# Patient Record
Sex: Male | Born: 2000 | Race: White | Hispanic: No | Marital: Single | Smoking: Never smoker
Health system: Southern US, Community
[De-identification: ages and names within clinical notes are randomized; demographics above are authoritative.]

## PROBLEM LIST (undated history)

## (undated) DIAGNOSIS — F988 Other specified behavioral and emotional disorders with onset usually occurring in childhood and adolescence: Secondary | ICD-10-CM

## (undated) DIAGNOSIS — J302 Other seasonal allergic rhinitis: Secondary | ICD-10-CM

---
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
## 2020-02-18 NOTE — Telephone Encounter (Signed)
 Yes, will send a letter out.   Thanks, Cherrie NOVAK    Electronically signed by: Cherrie Nat Schmitz, MSW 02/18/20 [phone]

## 2020-03-31 NOTE — Telephone Encounter (Signed)
 Yes, I will order [name] a new nebulizer. Were you able to confirm the address on file with family? Will need to this to be accurate for DME company    Electronically signed by: Cherrie Nat Schmitz, MSW 03/31/20 [phone]

## 2020-04-10 NOTE — Progress Notes (Signed)
 Cystic Fibrosis Psychology Pediatric CF Clinic Outpatient Progress Note   Name: Frederick Curry Date of birth (age): 2000/09/18 (19 y.o.) Date of contact: 04/10/20 Contact number: 3 Length of contact: 15 minutes   Nature of contact: This service meets with patients with cystic fibrosis to assess psychological functioning and address related emotional and/or behavioral concerns. This provider met with Frederick Curry for 15 minutes during his CF clinic visit.   Behavioral observations: Frederick Curry presented as a well-groomed 19 y.o. male. He presented with appropriate affect and was open, honest and engaged with this provider. Insight and judgement were fair. He was alert and oriented x3.   Annual CF Mental Health Screening: Frederick Curry completed the Patient Health Questionnaire-9 (PHQ-9) and the Generalized Anxiety Disorder-7 (GAD-7) as part of the annual CFF Mental Health Screening, and he was re-screened due to previously elevated scores. his scores today were as follows:  PHQ-9: 6 (Mild, improved from 10 at his previous visit) GAD-7:  9 (Mild)  Frederick Curry reported experiencing the following symptoms more than half the days of the past two weeks: feeling down, depressed or hopeless; feeling tired or having little energy; and feeling nervous, anxious, or on edge.SABRA He also reported experiencing the following symptoms nearly every day during the past two weeks: worrying too much about different things and becoming easily annoyed or irritable. Frederick Curry denied suicidal ideation on question 9 of the PHQ-9.  Frederick Curry reported that overall, things have been going ok for him. He is working in his [employer]. He feels that his mood has been pretty good and denied significant concerns. He denied any SI/HI/AVH and noted that his feelings towards his girlfriend have improved.   Intervention/Plan: Frederick Curry will be re-screened at his next clinic  visit. He was encouraged to reach out should he need anything in the future. We will continue to follow.   Impressions: Cystic fibrosis E84.9  Frederick Shuck, PhD Licensed Psychologist, Health Services Provider Phone: [phone]    Electronically signed by: Chad Ryan Marion, DO 04/12/20 1415

## 2020-11-26 NOTE — ED Provider Notes (Signed)
 ------------------------------------------------------------------------------- Attestation signed by Orlie Elsie Ash, MD at 11/27/2020  6:56 PM  ED Attending Physician:  Frederick Curry is a 20 y.o. male who presents with  Chief Complaint  Patient presents with  . Chest Pain    Pt reports left sided CP/lung pain x approx 2 hours; denies N/V/D, endorses headache   I supervised care provided by the resident and we have discussed the case. I have seen and examined the patient. I have reviewed the resident note and agree with the plan of treatment. ED data including laboratory and imaging studies reviewed by me and considered in medical decision making, and I agree with interpretations as documented.   -------------------------------------------------------------------------------   History   Chief Complaint  Patient presents with  . Chest Pain    Pt reports left sided CP/lung pain x approx 2 hours; denies N/V/D, endorses headache   HPI Frederick Curry is a 20 year old male with past medical history of cystic fibrosis who presents the emergency department for 2 hours of nonprogressive left-sided chest pain.  The patient states that he has been noncompliant with his cystic fibrosis daily therapeutic regimen including physical therapy and inhalers over the past 2 days.  This afternoon he states that he began feeling diffuse chest pain that is since localized to his left chest.  He states that this is the exact progression of symptoms and localization of pain that he typically experiences with pneumonia.  He does have associated shortness of breath that is mildly worse than baseline.  He states that his cough and sputum production have been baseline for him.  He has been afebrile at home with multiple temperature recordings at 98 to 99 F.  He has had no sick contacts lately.  He is otherwise feeling well  Past Medical History:  Diagnosis Date  . ADHD (attention deficit hyperactivity disorder)    . Asthma   . Cystic fibrosis (HCC)   . Staph infection    recent hospitilization for staph infectionin lungs per dad     Past Surgical History:  Procedure Laterality Date  . BOTOX INJECTION N/A 07/12/2016   Procedure: BOTOX INJECTION;  Surgeon: Marcey Lynwood Georgi, MD;  Location: [facility] PEDS OR;  Service: Urology;  Laterality: N/A;  . BOTOX INJECTION N/A 10/18/2016   Procedure: BOTOX INJECTION;  Surgeon: Marcey Lynwood Georgi, MD;  Location: [facility] PEDS OR;  Service: Urology;  Laterality: N/A;  . COSMETIC SURGERY  dog bite to face  . CYSTOSCOPY N/A 10/18/2016   Procedure: CYSTOSCOPY;  Surgeon: Marcey Lynwood Georgi, MD;  Location: [facility] PEDS OR;  Service: Urology;  Laterality: N/A;  . PORTACATH PLACEMENT  05/22/2012   Procedure: PORT-A-CATH INSERTION;  Surgeon: Norleen Vinie Jerry, MD;  Location: [facility] PEDS OR;  Service: Pediatric General;  Laterality: N/A;  Req 35  -  CONTACT PRECAUTIONS  Please place to follow first case of the day  . PORTACATH PLACEMENT  06/26/2012   Procedure: PORT-A-CATH INSERTION;  Surgeon: Norleen Vinie Jerry, MD;  Location: [facility] PEDS OR;  Service: Pediatric General;  Laterality: N/A;  . PORTACATH PLACEMENT N/A 07/15/2013   Procedure: PORT-A-CATH INSERTION;  Surgeon: Norleen Vinie Jerry, MD;  Location: [facility] PEDS OR;  Service: Pediatric General;  Laterality: N/A;  . PORTACATH REMOVAL  06/26/2012   Procedure: PORT-A-CATH REMOVAL;  Surgeon: Norleen Vinie Jerry, MD;  Location: [facility] PEDS OR;  Service: Pediatric General;  Laterality: N/A;  Remove and replace portacath  . PORTACATH REMOVAL N/A 07/15/2013   Procedure: PORT-A-CATH REMOVAL;  Surgeon: Norleen  Vinie Jerry, MD;  Location: [facility] PEDS OR;  Service: Pediatric General;  Laterality: N/A;  . PORTACATH REMOVAL Left 06/27/2017   Procedure: PORT-A-CATH REMOVAL;  Surgeon: Rea Earnie Situ, MD;  Location: [facility] PEDS OR;  Service: Pediatric General;  Laterality: Left;  . SKIN GRAFT      Family History  Problem Relation Age of Onset  . Heart disease Neg Hx      Social History   Tobacco Use  . Smoking status: Passive Smoke Exposure - Never Smoker  . Smokeless tobacco: Never Used  Vaping Use  . Vaping Use: Never used  Substance Use Topics  . Alcohol use: No     Review of Systems  Constitutional: Negative for chills and fever.  HENT: Negative for congestion.   Eyes: Negative for visual disturbance.  Respiratory: Positive for shortness of breath.   Cardiovascular: Positive for chest pain. Negative for leg swelling.  Gastrointestinal: Negative for abdominal pain and blood in stool.  Genitourinary: Negative for dysuria and hematuria.  Musculoskeletal: Negative for back pain.  Skin: Negative for rash.  Neurological: Negative for headaches.    Physical Exam    ED Triage Vitals [11/26/20 1749]  BP 129/96  MAP (mmHg) 103  Pulse 65  Resp 14  Temp 97.8 F (36.6 C)  SpO2 98 %  Weight     Physical Exam Constitutional:      General: He is not in acute distress.    Appearance: He is normal weight. He is not ill-appearing, toxic-appearing or diaphoretic.     Comments: Young thin appearing male in no acute distress.  HENT:     Head: Normocephalic and atraumatic.     Nose: No rhinorrhea.     Mouth/Throat:     Mouth: Mucous membranes are moist.  Eyes:     General: No scleral icterus.       Right eye: No discharge.        Left eye: No discharge.     Extraocular Movements: Extraocular movements intact.     Conjunctiva/sclera: Conjunctivae normal.  Cardiovascular:     Rate and Rhythm: Normal rate and regular rhythm.     Heart sounds: Normal heart sounds. No murmur heard. Pulmonary:     Effort: Pulmonary effort is normal. No respiratory distress.     Breath sounds: Normal breath sounds. No wheezing, rhonchi or rales.     Comments: Lungs are generally clear to auscultation bilaterally with potentially minimally reduced breath sounds at the left lung base.  Patient is speaking in full sentences without difficulty and is  nontachypneic.  There are no signs of respiratory distress. Chest:     Chest wall: No tenderness.  Abdominal:     General: Abdomen is flat. Bowel sounds are normal. There is no distension.     Palpations: Abdomen is soft. There is no mass.     Tenderness: There is no abdominal tenderness. There is no guarding or rebound.  Musculoskeletal:     Cervical back: Neck supple.     Right lower leg: No edema.     Left lower leg: No edema.  Skin:    General: Skin is warm and dry.     Findings: No rash.  Neurological:     General: No focal deficit present.     Mental Status: He is alert and oriented to person, place, and time. Mental status is at baseline.  Psychiatric:        Mood and Affect: Mood normal.  Behavior: Behavior normal.       ED Course  Procedures       MDM MDM    ER provider interpretation of imaging: I have personally reviewed patient's chest x-ray which was unremarkable for acute intrathoracic pathology. ER provider interpretation of EKG: Patient's EKG was remarkable for 1 mm of J-point elevation in leads V2 AVF, V5 and V6 with no prior EKG for comparison.  Patient was also noted to have hyperacute T waves.  ER provider interpretation of labs: Initial troponin of 4 is nonconcerning for ACS especially given his age and lack of risk factors. CBC and CMP unremarkable Medical decision making: Upon presentation, the patient is hemodynamically stable, afebrile and has been saturating at 98% on room air without any signs of respiratory distress or any focal findings on exam whatsoever.  His exam is generally reassuring.  As above, the patient's chest x-ray did not demonstrate any signs of pneumonia.  Patient's EKG demonstrated J-point elevation in forementioned leads with no prior for comparison.  I have briefly discussed the patient's EKG with the on-call cardiologist who agrees that this is J-point elevation and is normal for age.   Serial reassessment demonstrates that  the patient remains hemodynamically stable and is not demonstrating any signs of respiratory distress.  He continues to saturate at 98% on room air.  He remains afebrile.   Courtesy call was made to pulmonology informing that the patient had presented to the emergency department.  Per discussion option of the patient's presentation, pulmonology agrees that the patient is safe for discharge home.  Pulmonology that the patient is to will arrange arrange outpatient follow-up the following day or as soon as possible.  Plan plan to discharge patient home with strict return precautions for fever or increasing cough or shortness of breath.  The patient was instructed to resume his daily CF physiotherapy and medication regimen.  He was further told that the pulmonology clinic will be reaching out to him and that he should not schedule the next available appointment.  The patient was discharged home in stable condition    Clinical Impression:  1. Dyspnea on exertion   2. Left-sided chest pain     ED Disposition    ED Disposition Condition Comment   Discharge Stable [name] Norman Greet discharge to home/self care.                   Electronically signed by: Lynwood Fleming, MD Resident 11/27/20 1241    Electronically signed by: Elsie Merton Dollar, MD 11/27/20 [phone]

## 2020-11-26 NOTE — ED Triage Notes (Signed)
 Pt reports left sided CP/lung pain x approx 2 hours; denies N/V/D, endorses headache

## 2020-12-05 NOTE — Telephone Encounter (Signed)
 Left a generic message on Lennin's cell phone encouraging him to call back as needed.   Electronically signed by: Comer Jenkins Shuck, PhD 12/05/20 1215

## 2020-12-20 NOTE — Telephone Encounter (Signed)
 Returned call and spoke with Alysa. I let her know that I was calling as requested by [name] and that he gave me permission to speak with her. I asked her what questions she had that I could help answer.   Stewart shares that Maveryk is living with them and our big thing right now is that he has to work. I know that he has this illness and might not feel good, but we all have those moments.   I shared that we courage our patients with CF to work if they would like and our clinic role in that is to help support them as necessary. I explained that patients with CF might need to stay home from work if they are sick. I briefly stated that we are seeing Jere in clinic on Monday and can discuss further with him at that time.   No further questions at this time.   CANDIE Lum Mania, BSN RN Nurse Navigator Cystic Fibrosis Clinical Coordinator [facility] [phone]    Electronically signed by: Lauraine Lum Mania, RN 12/20/20 [phone]

## 2020-12-20 NOTE — Telephone Encounter (Signed)
 Yes, there is letter of support that can be provided for work. Could also discuss FMLA with him, if eligible through employer. Yes we can check in with him on Monday.  Thanks, Cherrie NOVAK    Electronically signed by: Cherrie Nat Schmitz, MSW 12/20/20 [phone]

## 2020-12-25 NOTE — Progress Notes (Signed)
 Cystic Fibrosis Psychology Pediatric CF Clinic Outpatient Progress Note   Name: Frederick Curry Date of birth (age): 07/12/2001 (20 y.o.) Date of contact: 12/25/20 Contact number: 4 Length of contact: 10 minutes   Lysle of contact: This service meets with patients with cystic fibrosis to assess psychological functioning and address related emotional and/or behavioral concerns. This provider met with Frederick Curry for 10 minutes during his CF clinic visit. His girlfriend's mother was present for this contact.     Behavioral observations: Frederick Curry presented as a well-groomed 20 y.o. male. He presented with appropriate affect and was open, honest and engaged with this provider. Insight and judgement were fair. He was alert and oriented x3.   Annual CF Mental Health Screening: Frederick Curry completed the Patient Health Questionnaire-9 [facility]) and the Generalized Anxiety Disorder-7 (GAD-7) as part of the annual CFF Mental Health Screening, and he was re-screened due to previously elevated scores. his scores today were as follows:  PHQ-8: 2 (Normal, improved from 6 at his previous visit) GAD-7: 3 (Normal, improved from 9 at his previous visit)  Frederick Curry reported experiencing normal symptoms of depression or anxiety. He denied any significant concerns, SI/HI/AVH.  Frederick Curry reported that he is doing well overall. He spent some time living with his mother, then with this father, but ultimately has found living with his girlfriends' family to be a better fit. He endorsed some work stress but is looking for another job at this time. He denied additional psychological needs.   Intervention/Plan: Frederick Curry will be re-screened in one year. He was encouraged to reach out should he need anything in the future.   Impressions: Cystic fibrosis E84.9  Frederick Shuck, PhD Licensed Psychologist, Health Services Provider Phone: [phone]      Electronically signed by:  Chad Ryan Marion, DO 12/25/20 2017

## 2021-03-05 NOTE — Unmapped External Note (Signed)
  Care Coordination Adult  Patient Account Number: [id number]        Patient:  Frederick Curry  MRN: [id number] DOB:  02-06-2001 Room/bed info not found Service:  Location: Room/bed info not found   Info & Contacts Assessment Completed: In-person interview with Patient The patient's status at this time is:: Able to communicate Prior to admission, patient resided at: Private residence Prior to admission, patient lived with : Other (Comment) (pt reside with girlfriend at girlfriends mom house) The patient's decision maker is:: Patient Barriers to education: No barriers     Assessment Was patient independent with ADLs prior to admission?: Yes Does this patient have or need any DME?: Yes (pt has airway clearance vest and nebulizer)  Social Does patient have a mental health diagnosis?: No Does patient have an acute substance abuse issue?: No Is the patient on any medication assisted treatment for substance abuse?: No Suspicion/Signs/Symptoms of Abuse: No Suspicion/Signs/Symptoms of Neglect: No        Educational/Vocational History Barriers to education: No barriers           Discharge Type of Payer Source: Medicaid ([facility])                  No education to display          SW met with pt Frederick Curry) in clinic accompanied by girlfriends mother and child. SW introduced new CF SW role and conduct annual assessment. Pt report that he doing well. Pt shared that he is no longer employed at [employer]. Pt is currently employed with a company similar to [employer]. Pt state that he enjoys his job and they are understanding of his medical situation. SW inquired of social needs or concerns. Pt report there are none at this time. SW provided pt with contact information for any potential needs or concerns.     Devin McLaurin, MSW, LCSWA    Electronically signed by: Devin McLaurin, MSW 03/05/21 [phone]

## 2022-04-05 NOTE — Telephone Encounter (Signed)
-------------------------------------------------------------------------------   Summary: PA Renewal -------------------------------------------------------------------------------  Medication Access Center Summary  Medication (new or continuation) [place] 100/50/75 & 150 Continuation of Therapy   PA Status APPROVED   PA Approval Dates:  PA Number:   04/05/2022 - 04/05/2023   [id number]  Provider Dr.Krall   Insurance [facility] Medicaid   Pharmacy [facility]      Electronically signed by: Annabella Nat Gravely, CPhT 04/05/22 1845

## 2023-09-17 ENCOUNTER — Emergency Department (HOSPITAL_BASED_OUTPATIENT_CLINIC_OR_DEPARTMENT_OTHER): Payer: Medicaid Other | Admitting: Radiology

## 2023-09-17 ENCOUNTER — Encounter (HOSPITAL_BASED_OUTPATIENT_CLINIC_OR_DEPARTMENT_OTHER): Payer: Self-pay

## 2023-09-17 ENCOUNTER — Emergency Department (HOSPITAL_BASED_OUTPATIENT_CLINIC_OR_DEPARTMENT_OTHER)
Admission: EM | Admit: 2023-09-17 | Discharge: 2023-09-17 | Disposition: A | Discharge status: Home / Self Care | Payer: Medicaid Other | Attending: Emergency Medicine | Admitting: Emergency Medicine

## 2023-09-17 ENCOUNTER — Other Ambulatory Visit (HOSPITAL_BASED_OUTPATIENT_CLINIC_OR_DEPARTMENT_OTHER): Payer: Self-pay

## 2023-09-17 ENCOUNTER — Other Ambulatory Visit: Payer: Self-pay

## 2023-09-17 DIAGNOSIS — R519 Headache, unspecified: Secondary | ICD-10-CM | POA: Insufficient documentation

## 2023-09-17 DIAGNOSIS — J22 Unspecified acute lower respiratory infection: Secondary | ICD-10-CM | POA: Diagnosis not present

## 2023-09-17 DIAGNOSIS — Z91040 Latex allergy status: Secondary | ICD-10-CM | POA: Diagnosis not present

## 2023-09-17 DIAGNOSIS — R059 Cough, unspecified: Secondary | ICD-10-CM | POA: Diagnosis present

## 2023-09-17 DIAGNOSIS — Z20822 Contact with and (suspected) exposure to covid-19: Secondary | ICD-10-CM | POA: Diagnosis not present

## 2023-09-17 LAB — EXPECTORATED SPUTUM ASSESSMENT W GRAM STAIN, RFLX TO RESP C

## 2023-09-17 LAB — BASIC METABOLIC PANEL
Anion gap: 5 (ref 5–15)
BUN: 17 mg/dL (ref 6–20)
CO2: 30 mmol/L (ref 22–32)
Calcium: 9.6 mg/dL (ref 8.9–10.3)
Chloride: 100 mmol/L (ref 98–111)
Creatinine, Ser: 0.73 mg/dL (ref 0.61–1.24)
GFR, Estimated: >60 mL/min (ref >60)
Glucose, Bld: 94 mg/dL (ref 70–99)
Potassium: 3.7 mmol/L (ref 3.5–5.1)
Sodium: 135 mmol/L (ref 135–145)

## 2023-09-17 LAB — CBC WITH DIFFERENTIAL/PLATELET
Abs Immature Granulocytes: 0.02 10*3/uL (ref 0.00–0.07)
Basophils Absolute: 0.1 10*3/uL (ref 0.0–0.1)
Basophils Relative: 1 %
Eosinophils Absolute: 0.6 10*3/uL — ABNORMAL HIGH (ref 0.0–0.5)
Eosinophils Relative: 4 %
HCT: 45 % (ref 39.0–52.0)
Hemoglobin: 14.8 g/dL (ref 13.0–17.0)
Immature Granulocytes: 0 %
Lymphocytes Relative: 24 %
Lymphs Abs: 3.1 10*3/uL (ref 0.7–4.0)
MCH: 29.4 pg (ref 26.0–34.0)
MCHC: 32.9 g/dL (ref 30.0–36.0)
MCV: 89.3 fL (ref 80.0–100.0)
Monocytes Absolute: 1.5 10*3/uL — ABNORMAL HIGH (ref 0.1–1.0)
Monocytes Relative: 12 %
Neutro Abs: 7.7 10*3/uL (ref 1.7–7.7)
Neutrophils Relative %: 59 %
Platelets: 427 10*3/uL — ABNORMAL HIGH (ref 150–400)
RBC: 5.04 MIL/uL (ref 4.22–5.81)
RDW: 13.6 % (ref 11.5–15.5)
WBC: 13 10*3/uL — ABNORMAL HIGH (ref 4.0–10.5)
nRBC: 0 % (ref 0.0–0.2)

## 2023-09-17 LAB — RESP PANEL BY RT-PCR (RSV, FLU A&B, COVID)  RVPGX2
Influenza A by PCR: NEGATIVE
Influenza B by PCR: NEGATIVE
Resp Syncytial Virus by PCR: NEGATIVE
SARS Coronavirus 2 by RT PCR: NEGATIVE

## 2023-09-17 MED ORDER — ONDANSETRON HCL 4 MG/2ML IJ SOLN
4.0000 mg | Freq: Once | INTRAMUSCULAR | Status: AC
  Administered 2023-09-17: 4 mg via INTRAVENOUS
  Filled 2023-09-17: qty 2

## 2023-09-17 MED ORDER — ACETAMINOPHEN 325 MG PO TABS
650.0000 mg | ORAL_TABLET | Freq: Once | ORAL | Status: AC
Start: 2023-09-17 — End: 2023-09-17
  Administered 2023-09-17: 650 mg via ORAL
  Filled 2023-09-17: qty 2

## 2023-09-17 MED ORDER — SODIUM CHLORIDE 3 % IN NEBU
4.0000 mL | INHALATION_SOLUTION | Freq: Once | RESPIRATORY_TRACT | Status: AC
  Administered 2023-09-17: 4 mL via RESPIRATORY_TRACT
  Filled 2023-09-17: qty 4

## 2023-09-17 MED ORDER — LEVOFLOXACIN 750 MG PO TABS
750.0000 mg | ORAL_TABLET | Freq: Every day | ORAL | 0 refills | Status: AC
  Filled 2023-09-17: qty 14, 14d supply, fill #0

## 2023-09-17 MED ORDER — CIPROFLOXACIN HCL 750 MG PO TABS
750.0000 mg | ORAL_TABLET | Freq: Two times a day (BID) | ORAL | 0 refills | Status: DC
  Filled 2023-09-17: qty 28, 14d supply, fill #0

## 2023-09-17 NOTE — ED Notes (Signed)
 Called [name] at [facility] for [facility] Pulm consult [phone] 09:09

## 2023-09-17 NOTE — ED Notes (Signed)
 Recalled [facility], spoke w/[name], requested another call back from Kristy Phenes, MD 09:48

## 2023-09-17 NOTE — ED Provider Notes (Addendum)
 [place] EMERGENCY DEPARTMENT AT [facility] Provider Note   CSN: [id number] Arrival date & time: 09/17/23  9361     History  Chief Complaint  Patient presents with   Nasal Congestion   Cough    Frederick Curry is a 23 y.o. male.  Patient with history of cystic fibrosis follows [facility], history of Pseudomonas presents with congestion, cough and shortness of breath worsening over the past 24 hours.  Mother has tried his saline breathing treatments, and tobramycin treatments nebulized with no improvement.  Patient's had productive sputum and fever up to 103 yesterday.  No current oral antibiotics.  Patient follows with pediatric pulmonology at [facility].  The history is provided by the patient and a parent.  Cough Associated symptoms: fever, headaches and shortness of breath   Associated symptoms: no chest pain, no chills and no rash        Home Medications Prior to Admission medications   Medication Sig Start Date End Date Taking? Authorizing Provider  albuterol (PROVENTIL HFA;VENTOLIN HFA) 108 (90 BASE) MCG/ACT inhaler Inhale into the lungs every 6 (six) hours as needed for wheezing or shortness of breath.    [provider]  AMOXICILLIN PO Take by mouth. Monday, Wednesday, Friday    [provider]  amphetamine-dextroamphetamine (ADDERALL XR) 20 MG 24 hr capsule Take 20 mg by mouth.    [provider]  azithromycin (ZITHROMAX) 500 MG tablet TAKE 1 TABLET EVERY MON/WED/FRI. 10/02/15   [provider]  beclomethasone (QVAR) 80 MCG/ACT inhaler Inhale into the lungs. 02/22/16   [provider]  Beclomethasone Dipropionate (QVAR IN) Inhale into the lungs.    [provider]  cyproheptadine (PERIACTIN) 4 MG tablet Take 8 mg by mouth. 03/04/16   [provider]  desmopressin (DDAVP NASAL) 0.01 % solution Place 10 mcg into the nose 2 (two) times daily.    [provider]  desmopressin (DDAVP)  0.2 MG tablet Take 3 tablets by mouth 1 time daily as directed. 12/14/15   [provider]  DIGESTIVE ENZYMES PO Take by mouth.    [provider]  dornase alpha (PULMOZYME) 1 MG/ML nebulizer solution Take by nebulization daily.    [provider]  dornase alpha (PULMOZYME) 1 MG/ML nebulizer solution Inhale into the lungs.    [provider]  fluticasone (FLONASE) 50 MCG/ACT nasal spray Place into both nostrils daily.    [provider]  fluticasone (FLONASE) 50 MCG/ACT nasal spray Place into the nose. 01/17/15   [provider]  hydrocortisone  1 % ointment Apply 1 application topically 2 (two) times daily. Apply to bilateral feet x 2 wks 04/24/16   Janit Thresa HERO, DPM  lidocaine -prilocaine (EMLA) cream Apply topically as needed. 10/25/15   [provider]  lipase/protease/amylase (CREON-12/PANCREASE) 12000 UNITS CPEP capsule Take by mouth.    [provider]  Lisdexamfetamine Dimesylate (VYVANSE PO) Take by mouth.    [provider]  Loratadine (CLARITIN PO) Take by mouth.    [provider]  methylphenidate (RITALIN) 10 MG tablet One by mouth each afternoon for homework. 02/12/16   [provider]  Multiple Vitamin (MULTIVITAMIN) tablet Take 1 tablet by mouth daily. Special mixture for cystic fibrosis    [provider]  Omeprazole (PRILOSEC PO) Take by mouth.    [provider]  omeprazole (PRILOSEC) 20 MG capsule Take 1 capsule by mouth 1 time daily. 12/15/15   [provider]  ondansetron  (ZOFRAN  ODT) 4 MG  disintegrating tablet 4mg  ODT q4 hours prn nausea/vomit 11/02/13   Geroldine Berg, MD  Pancrelipase, Lip-Prot-Amyl, (CREON) 24000 units CPEP Take 5 capsules with meals and 3-4 with snacks. Brand name medically necessary. 07/07/15   [provider]  Polyethylene Glycol 3350 (MIRALAX PO) Take by mouth daily.    [provider]  polyethylene glycol powder  (GLYCOLAX/MIRALAX) powder 17 g. 06/22/13   [provider]  sodium chloride  HYPERTONIC 3 % nebulizer solution Take by nebulization as needed for other.    [provider]  Sodium Chloride , Inhalant, (HYPER-SAL) 7 % NEBU Nebulize the contents of 1 vial in the morning and in the evening. 07/07/15   [provider]  Sodium Chloride , Inhalant, 7 % NEBU Nebulize the contents of 1 vial in the morning and in the evening. 07/07/15   [provider]  solifenacin (VESICARE) 5 MG tablet Take 5 mg by mouth. 04/19/15   [provider]  Tobramycin 28 MG CAPS Place into inhaler and inhale. 10/25/15   [provider]  Tobramycin POWD by Does not apply route. monthly    [provider]      Allergies    Latex, Silver, and Tape    Review of Systems   Review of Systems  Constitutional:  Positive for fever. Negative for chills.  HENT:  Positive for congestion.   Eyes:  Negative for visual disturbance.  Respiratory:  Positive for cough and shortness of breath.   Cardiovascular:  Negative for chest pain.  Gastrointestinal:  Negative for abdominal pain and vomiting.  Genitourinary:  Negative for dysuria and flank pain.  Musculoskeletal:  Negative for back pain, neck pain and neck stiffness.  Skin:  Negative for rash.  Neurological:  Positive for headaches. Negative for light-headedness.    Physical Exam Updated Vital Signs BP 115/82 (BP Location: Left Arm)   Pulse 74   Temp 97.8 F (36.6 C)   Resp 20   Ht 6' 1 (1.854 m)   Wt 79.4 kg   SpO2 97%   BMI 23.09 kg/m  Physical Exam Vitals and nursing note reviewed.  Constitutional:      General: He is not in acute distress.    Appearance: He is well-developed.  HENT:     Head: Normocephalic and atraumatic.     Nose: Congestion present.     Mouth/Throat:     Mouth: Mucous membranes are moist.  Eyes:     General:        Right eye: No discharge.        Left eye: No discharge.      Conjunctiva/sclera: Conjunctivae normal.  Neck:     Trachea: No tracheal deviation.  Cardiovascular:     Rate and Rhythm: Normal rate and regular rhythm.     Heart sounds: No murmur heard. Pulmonary:     Effort: Pulmonary effort is normal. No respiratory distress.     Comments: Decreased breath sounds bilateral, occasional rales bilateral. Abdominal:     General: There is no distension.     Palpations: Abdomen is soft.     Tenderness: There is no abdominal tenderness. There is no guarding.  Musculoskeletal:     Cervical back: Normal range of motion and neck supple. No rigidity.  Skin:    General: Skin is warm.     Capillary Refill: Capillary refill takes less than 2 seconds.     Findings: No rash.  Neurological:     General: No focal deficit present.  Mental Status: He is alert.     Cranial Nerves: No cranial nerve deficit.  Psychiatric:        Mood and Affect: Mood normal.     ED Results / Procedures / Treatments   Labs (all labs ordered are listed, but only abnormal results are displayed) Labs Reviewed  CBC WITH DIFFERENTIAL/PLATELET - Abnormal; Notable for the following components:      Result Value   WBC 13.0 (*)    Platelets 427 (*)    Monocytes Absolute 1.5 (*)    Eosinophils Absolute 0.6 (*)    All other components within normal limits  RESP PANEL BY RT-PCR (RSV, FLU A&B, COVID)  [id number]  RESPIRATORY PANEL BY PCR  EXPECTORATED SPUTUM ASSESSMENT W GRAM STAIN, RFLX TO RESP C  BASIC METABOLIC PANEL    EKG None  Radiology DG Chest 2 View Result Date: 09/17/2023 CLINICAL DATA:  Coughing congestion.  Shortness of breath. EXAM: CHEST - 2 VIEW COMPARISON:  07/13/2023 FINDINGS: Patchy areas of nodular opacity are seen diffusely in both lungs with somewhat of a clustering distribution in the upper and lower lungs bilaterally. No pleural effusion. The cardiopericardial silhouette is within normal limits for size. No acute bony abnormality. IMPRESSION: Interval  progression of clustered nodularity in both upper and lower lungs in the interval since the prior study. Given patient age, findings are likely infectious/inflammatory although metastatic disease could have this appearance. CT chest would likely prove helpful to further evaluate. Electronically Signed   By: Camellia Candle M.D.   On: 09/17/2023 08:56    Procedures Procedures    Medications Ordered in ED Medications  sodium chloride  HYPERTONIC 3 % nebulizer solution 4 mL (4 mLs Nebulization Given 09/17/23 0850)  ondansetron  (ZOFRAN ) injection 4 mg (4 mg Intravenous Given 09/17/23 9185)    ED Course/ Medical Decision Making/ A&P                                 Medical Decision Making Amount and/or Complexity of Data Reviewed Labs: ordered. Radiology: ordered.  Risk Prescription drug management.   Patient with known cystic fibrosis presents with clinical concern for worsening respiratory infection with underlying chronic lung disease. Clinical concern for viral versus bacterial/Pseudomonas leading to persistent symptoms.  Viral testing sent influenza and COVID-negative.   Patient persistently coughing in the room, no persistent retractions, normal oxygen saturation.  Medical records reviewed and note on December 4 when patient was on ofloxacin and following with Dr. Marcos.  Plan for blood work which is independent reviewed mild leukocytosis 13,000, electrolytes hemoglobin unremarkable no signs of anemia.  Chest x-ray reviewed independently.  3% saline nebulizer ordered and sputum culture.  Blood work results reviewed independently reassuring mild leukocytosis 13,000, electrolytes and kidney function unremarkable.  No anemia.  Patient has normal oxygen saturation and normal work of breathing on reassessment.  Chest x-ray showed acute on chronic findings worsening nodularity.  Discussed with [facility] Delon Armor who recommended Levaquin  750 mg daily for 14 days and increased  airway clearance up to 6 times a day.  Updated mother on plan of care and to follow-up on Monday in the clinic.  The CF office will call in more Pulmozyme and 3% saline for the patient.        Final Clinical Impression(s) / ED Diagnoses Final diagnoses:  Cystic fibrosis (HCC)  Acute lower respiratory infection    Rx / DC Orders ED  Discharge Orders     None         Tonia Chew, MD 09/17/23 9062    Tonia Chew, MD 09/17/23 [phone]

## 2023-09-17 NOTE — ED Triage Notes (Signed)
 Pt reports congestion, cough and SHOB over the last day. Pt has hx of cystic fibrosis.

## 2023-09-17 NOTE — Discharge Instructions (Addendum)
 Take Levaquin  antibiotic 750 mg daily for 14 days. The office will call in your nebulizer solutions.   Follow-up with pulmonology on Monday for reassessment. Call the office [phone] if you have worsening symptoms or questions. Use Tylenol  every 4 hours as needed for pain or fevers. Use your airway clearance approximately 6 times a day.

## 2023-09-19 LAB — RESPIRATORY PANEL BY PCR

## 2024-03-30 NOTE — H&P (Signed)
 Hospitalist Admission History and Physical    Chief Complaint  Shortness of breath   HPI  Frederick Curry is a 23 y.o. year old male with a PMH of cystic fibrosis who came to the hospital with complaints of shortness of breath and burning sensation in his lungs which began last night.  Patient reports that he has overall felt poorly for a week or 2.  He reports that he has had positive sputum production with cough especially when showering but notes that this is not new and has been going on since he was an infant.  He reports no fever at home.  Emergency department contacted pulmonology with following recommendations:  CF sputum culture IV Zosyn Increased airway clearance 7% Hypertonic saline BD Dornase alpha They can transfer to main campus if needed or call pulmonary consult team  Patient indicates that following 1 dose of Zosyn, he already feels improved.  Patient Also indicates that he has a function later this week on Saturday and is very hopeful to leave the hospital on Friday.  Pulmonology team does indicate that if patient must leave the hospital AGAINST MEDICAL ADVICE, ciprofloxacin  could be utilized.  Patient reports no tobacco nor alcohol use.  He quit smoking tobacco approximately 3 to 6 months ago    Assessment and Plan     Principal Problem:   Pneumonia due to infectious organism Resolved Problems:   * No resolved hospital problems. *    Pseudomonas pneumonia in the setting of cystic fibrosis we will try to follow recommendations from pulmonology team.  Patient reports Zosyn has been helpful and he already feels some improvement.  We will try to continue physiotherapy vest treatment, dornase alfa and 7% hypertonic saline inhalation.  If improvement is not seen, transfer to the main campus in [place] may be appropriate.  We will request pulmonology and infectious disease consultation     Diet: No diet orders on file   Code Status: No Order   DVT  Prophylaxis: SQ Heparin  Anticipated disposition is to home     History  Past Medical and Surgical History, Family History, Social History Medical History[1] Surgical History[2] Family History[3] Social History   Socioeconomic History  . Marital status: Single    Spouse name: Not on file  . Number of children: Not on file  . Years of education: Not on file  . Highest education level: Not on file  Occupational History  . Not on file  Tobacco Use  . Smoking status: Never    Passive exposure: Yes  . Smokeless tobacco: Never  Substance and Sexual Activity  . Alcohol use: No  . Drug use: Not on file  . Sexual activity: Not on file  Other Topics Concern  . Not on file  Social History Narrative   ** Merged History Encounter **       Frederick Curry lives with his Mom, Step-dad, and 2 sisters.  They have 1 dog (Roxanne).  There are no smokers at home.  7th grader at [employer].  03/21/15: There have been no changes at home since Frederick Curry's last Pulmonary visit.  He'll be going into     the 8th grade in a few weeks.  09/07/15: Mom will be having back surgery soon so Frederick Curry will be staying with his father until Mom has recovered (anticipated through the rest of the school year).  At Frederick Curry will also be living with his 47 year ol   d  brother, 1 dog  and 2 cats.  Dad uses a vape.  01/30/16: There have been no changes at home since Frederick Curry's last Pulmonary visit, though Dad reports [name]'s GP's will be moving out in the next 1-2 months.  04/16/16: Frederick Curry has been back at his Mom's h   ouse  for 4 days, and he'll start 9th grade at [employer] later this week.  01/07/17: Mom has a new job, started 2 months ago; there have been no other changes at home.  05/26/18:  Frederick Curry is in 11th grade.   Social Drivers of Health   Food Insecurity: Unknown (07/03/2021)   Received from [facility]   Food vital sign   . Within the past 12 months, you worried that your food  would run out before you got money to buy more: Patient declined   . Within the past 12 months, the food you bought just didn't last and you didn't have money to get more: Patient declined  Transportation Needs: No Transportation Needs (07/03/2021)   Received from [facility] - Transportation   . Lack of Transportation (Medical): No   . Lack of Transportation (Non-Medical): No  Safety: Unknown (11/13/2021)   Received from [facility]   HITS   . Physically Hurt: Not on file   . Insult or Talk Down To: Not on file   . Threaten Physical Harm: Not on file   . Scream or Curse: Not on file  Living Situation: High Risk (07/03/2021)   Received from [facility] Stability Vital Sign   . Unable to Pay for Housing in the Last Year: Patient refused   . Number of Places Lived in the Last Year: 3   . In the last 12 months, was there a time when you did not have a steady place to sleep or slept in a shelter (including now)?: Yes      Allergies  Allergies[4]   Home Medications  Home Medications     Med List Status: In progress Set By: Linnea JONETTA Essex, CPhT at 03/30/2024  9:41 PM          * albuterol HFA (PROVENTIL HFA;VENTOLIN HFA;PROAIR HFA) 90 mcg/actuation inhaler    Inhale 2 puffs every 4 (four) hours as needed for wheezing or shortness of breath.   * budesonide-formoteroL (SYMBICORT) 80-4.5 mcg/actuation inhaler    Inhale 2 puffs 2 (two) times a day.   * cetirizine (ZyrTEC) 10 mg tablet    Take 10 mg by mouth Once Daily.   * cholecalciferol (VITAMIN D3) 1,250 mcg (50,000 unit) capsule    Take 1 each (50,000 Units total) by mouth every 7 days. (12 weeks)   * desmopressin (DDAVP) 0.2 mg tablet    TAKE THREE TABLETS BY MOUTH DAILY   * dornase alfa (Pulmozyme) 1 mg/mL nebulizer solution    Take 2.5 mg by nebulization in the morning and 2.5 mg before bedtime.   * fluticasone propionate (FLONASE) 50 mcg/spray nasal spray    2 sprays nightly.   * Kitabis Pak 300  mg/5 mL nebu    Nebulize the contents of 1 ampule in the morning and in the evening. Separate doses by at least 6 hours. Alternate every other 28 days.   * nebulizers misc    Use as directed to administer hypertonic saline and pulmozyme.   * omeprazole (PriLOSEC) 20 mg DR capsule    TAKE ONE CAPSULE BY MOUTH DAILY   * pancrelipase, Lip-Prot-Amyl, (Creon) 24,000-76,000 -  120,000 unit capsule    TAKE 4 CAPSULES WITH MEALS THREE TIMES DAILY AND 2 CAPSULES WITH SNACKS THREE TIMES DAILY FOR MAXIMUM OF 18 CAPSULES PER DAY   --- BRAND NAME MEDICALLY NECESSARY   * pediatric multivit 61-D3-vit K (MVW Complete Formulation D5000) 5,000-800 unit-mcg capsule    Take 1 capsule by mouth 2 (two) times a day.   * sertraline (ZOLOFT) 50 mg tablet    Take 50 mg by mouth Once Daily.   * sodium chloride  7 % nebu    Take 4 mL by nebulization 2 (two) times a day.   * Trikafta 100-50-75 mg(d) /150 mg (n) TbSQ    Take 2 orange tablets in the morning and 1 light blue tablet in the evening by mouth with fat-containing food as directed by physician. Take doses 12 hours apart        Review of Systems  Pertinent positives and negatives in HPI    Physical Exam  Temp:  [98.4 F (36.9 C)-99.6 F (37.6 C)] 98.4 F (36.9 C) Heart Rate:  [77-106] 77 Resp:  [16-18] 16 BP: (115-119)/(66-76) 115/76 Body mass index is 19.26 kg/m.   General appearance -awake and alert.  Appears comfortable with no acute distress Mental status - answering questions appropriately, mood appropriate for situation Eyes - pupils equal and reactive, extraocular eye movements intact Mouth - mucous membranes moist, pharynx normal without lesions Chest -decreased breath sounds, no wheezing Heart - normal rate, regular rhythm, normal S1, S2, no murmurs, rubs, clicks or gallops Abdomen - soft, nontender, nondistended, no masses or organomegaly    Labs and Results  I have reviewed the following labs and results:  Labs: Lab Results   Component Value Date   WBC 14.16 (H) 03/30/2024   HGB 15.1 03/30/2024   HCT 44.7 03/30/2024   MCV 88.2 03/30/2024   PLT 386 03/30/2024   Lab Results  Component Value Date   GLUCOSE 80 03/30/2024   CALCIUM 9.7 03/30/2024   NA 135 (L) 03/30/2024   K 4.0 03/30/2024   CO2 28 03/30/2024   CL 100 03/30/2024   BUN 12 03/30/2024   CREATININE 0.78 03/30/2024   Lab Results  Component Value Date   ALT 21 03/30/2024   AST 17 03/30/2024   GGT 22 04/21/2023   BILITOT 1.0 03/30/2024   Lab Results  Component Value Date   INR 1.1 04/21/2023   INR 1.00 06/11/2021   INR 1.08 04/10/2020   PROTIME 11.7 04/21/2023   PROTIME 10.6 06/11/2021   PROTIME 11.7 04/10/2020    Micro: No results found for this visit on 03/30/24 (from the past 48 hours).  Radiology: XR Chest 2 Views  Final Result by Delmar Herbert Winfred Booker Results In [place] [id number] (08/19 1511)  CLINICAL DATA:  Short of breath, illness for 2 weeks    EXAM:  CHEST - 2 VIEW    COMPARISON:  09/17/2023    FINDINGS:  Frontal and lateral views of the chest are obtained on 3 images.  There is peripheral consolidation within the right upper lobe,  consistent with pneumonia. No effusion or pneumothorax. Cardiac  silhouette is unremarkable. No acute bony abnormalities.    IMPRESSION:  1. Peripheral right upper lobe consolidation compatible with  pneumonia.      Electronically Signed    By: [name] Daring M.D.    On: 03/30/2024 15:11        EKG: No results found for this visit on 03/30/24.    Electronically signed by:  Jama Rome Louder, MD 03/30/2024 9:49 PM        [1] Past Medical History: Diagnosis Date  . ADHD (attention deficit hyperactivity disorder)   . Asthma (CMD)   . Cystic fibrosis    (CMD)   . Staph infection    recent hospitilization for staph infectionin lungs per dad   [2] Past Surgical History: Procedure Laterality Date  . BOTOX THERAPY N/A 07/12/2016   Procedure: BOTOX INJECTION;  Surgeon: Marcey Lynwood Georgi, MD;  Location: [facility] PEDS OR;  Service: Urology;  Laterality: N/A;  . BOTOX THERAPY N/A 10/18/2016   Procedure: BOTOX INJECTION;  Surgeon: Marcey Lynwood Georgi, MD;  Location: [facility] PEDS OR;  Service: Urology;  Laterality: N/A;  . COSMETIC SURGERY  dog bite to face   Procedure: COSMETIC SURGERY  . CYSTOSCOPY N/A 10/18/2016   Procedure: CYSTOSCOPY;  Surgeon: Marcey Lynwood Georgi, MD;  Location: [facility] PEDS OR;  Service: Urology;  Laterality: N/A;  . INSERT / REPLACE / VENOUS ACCESS CATHETER  06/26/2012   Procedure: PORT-A-CATH REMOVAL;  Surgeon: Norleen Vinie Jerry, MD;  Location: [facility] PEDS OR;  Service: Pediatric General;  Laterality: N/A;  Remove and replace portacath  . INSERT / REPLACE / VENOUS ACCESS CATHETER N/A 07/15/2013   Procedure: PORT-A-CATH REMOVAL;  Surgeon: Norleen Vinie Jerry, MD;  Location: [facility] PEDS OR;  Service: Pediatric General;  Laterality: N/A;  . INSERT / REPLACE / VENOUS ACCESS CATHETER Left 06/27/2017   Procedure: PORT-A-CATH REMOVAL;  Surgeon: Rea Earnie Situ, MD;  Location: [facility] PEDS OR;  Service: Pediatric General;  Laterality: Left;  . PORTACATH PLACEMENT  05/22/2012   Procedure: PORT-A-CATH INSERTION;  Surgeon: Norleen Vinie Jerry, MD;  Location: [facility] PEDS OR;  Service: Pediatric General;  Laterality: N/A;  Req 35  -  CONTACT PRECAUTIONS   Please place to follow first case of the day  . PORTACATH PLACEMENT  06/26/2012   Procedure: PORT-A-CATH INSERTION;  Surgeon: Norleen Vinie Jerry, MD;  Location: [facility] PEDS OR;  Service: Pediatric General;  Laterality: N/A;  . PORTACATH PLACEMENT N/A 07/15/2013   Procedure: PORT-A-CATH INSERTION;  Surgeon: Norleen Vinie Jerry, MD;  Location: [facility] PEDS OR;  Service: Pediatric General;  Laterality: N/A;  . SKIN GRAFT     Procedure: SKIN GRAFT  [3] Family History Problem Relation Name Age of Onset  . Heart disease Neg Hx    [4] Allergies Allergen Reactions  . Silver Rash    Skin breaks out, Skin breaks out, Skin breaks out  . Latex, Natural  Rubber Other (See Comments)    & Regular Band-Aids, Can use paper tape  . Adhesive Rash    & Regular Band-Aids, Can use paper tape  . Latex Rash and Itching  *Some images could not be shown.

## 2024-03-30 NOTE — Care Plan (Signed)
   [facility]  Division of Pharmacy Services  Pharmacy Monitoring - Automatic Renal Adjustment   Note: Only antimicrobials may be upwardly adjusted at initial order verification   Demographics:  Age: 23 y.o.  Sex: male. Height: Height: 1.88 m (6' 2) (03/30/2024  2:37 PM)  Weight: 68 kg (150 lb)  Ideal body weight: 82.2 kg (181 lb 3.5 oz)  Body mass index is 19.26 kg/m. Allergies: Silver; Latex, natural rubber; Adhesive; and Latex  Pertinent Labs:  Serum creatinine: 0.78 mg/dL 91/80/74 8497 Estimated creatinine clearance: 141.7 mL/min Dialysis Status:No Active Dialysis Orders  Creatinine  Date Value Ref Range Status  03/30/2024 0.78 0.70 - 1.30 mg/dL Final  90/90/7975 9.14 0.70 - 1.30 mg/dL Final   HX CREATININE  Date Value Ref Range Status  06/11/2021 0.82 0.70 - 1.30 MG/DL Final  95/82/7977 9.21 0.50 - 1.50 MG/DL Final  90/79/7978 9.13 0.50 - 1.50 MG/DL Final  91/69/7978 9.25 0.50 - 1.50 MG/DL Final   Medications Eligible for Automatic Renal Adjustment:  piperacillin-tazobactam (ZOSYN) 3.375 g in sodium chloride  0.9 % 100 mL IVPB [[id number]].  Assessment / Plan  Per [facility] Automatic Renal Dose Adjustment Protocol,   will adjust ... Zosyn 3.375g q6h  Pharmacy will continue to follow the patient's renal function and clinical progress daily.   Kenneth Heather, Pharmacist

## 2024-03-30 NOTE — ED Provider Notes (Signed)
 Patient placed in First Look pathway, seen and evaluated for chief complaint of shortness of breath since last night.  Also reports feeling generally unwell for 2 weeks. Hx of CF. No known sick contact exposures. Pertinent exam findings include nontoxic appearing, NAD.   Patient/parent counseled on process, plan, and necessity for staying for completing the evaluation.      Note By: Metta Lines, PA-C 2:48 PM   [facility] Emergency Department Emergency Department Provider Note  This document was created using the aid of voice recognition Dragon dictation software  ____________________________________________  Time seen: 9:54 PM  I have reviewed the triage vital signs and the nursing notes.   History   Chief Complaint Shortness of Breath   HPI  Frederick Curry is a 23 y.o. male with PMHx of Cystic Fibrosis, Pseudomonas GERD, asthma presents to the ED with complaint of shortness of breath since last night. He also complains of not feeling well for 2 weeks. He complains of severe headache, states he has not eaten all day. He also complains of fatigue, states he does not have his medications. Mother at bedside reports he is out of his nebulizer treatments. He has a monarch vest and the pods on the back were not working, was able to get that fixed today. Mother reports the pseudomonas has become active, states he has a rash on his upper chest onset today which is typical for the pseudomonas. He reports  productive cough with thick green/yellow sputum which is his normal. Mother reports symptoms started a week ago, progressively worsened yesterday about 5-6PM. She states he just wants to sleep. He denies recent fevers. He is on Trifkafta as a maintenance, has been on for a few days.    Physical Exam   VITAL SIGNS:   ED Triage Vitals [03/30/24 1437]  Temp 99.6 F (37.6 C)  Heart Rate 106  Resp 18  BP 119/73  MAP (mmHg) 88  SpO2 96 %  O2 Device None (Room air)  O2  Flow Rate (L/min)   Weight 68 kg (150 lb)    Constitutional: Alert and oriented. Well appearing and in no distress. Eyes: Conjunctivae are normal. ENT      Head: Normocephalic and atraumatic.      Neck: No stridor. Cardiovascular: Normal rate and rhythm. Heart rate: 75. Blood pressure 117/76. No cardiac murmur.  Respiratory: Normal respiratory effort. Breath sounds are normal. Oxygen saturation 95% on room air.  No wheezing rales or rhonchi. Gastrointestinal: Soft and nontender. Musculoskeletal: No deformities noted. No lower extremity swelling bilaterally. Skin: There is a faint macular rash to the chest.  No petechiae no purpura no ulcerations no vesicles. Neurologic: Normal speech and language. No gross focal neurologic deficits are appreciated. Psychiatric: Mood and affect are normal.   EKG   According to my interpretation the ECG shows None  Radiology   All X-rays, CTs, and MRIs interpreted by radiologist and interpretation reviewed by me.  Procedures   Procedure(s) performed: None.  Pertinent labs & imaging results that were available during my care of the patient were reviewed by me and considered in my medical decision making (see chart for details).  Total critical care time was 0 minutes. This was spent providing cardiovascular or respiratory resuscitation in a critically ill patient.    ED Clinical Impression   1. Pneumonia of right upper lobe due to infectious organism Active  2. Cystic fibrosis    (CMD) Active    Medical Decision Making: Initial  Impression, ED Course, Assessment and Plan   Per my interpretation the bedside cardiac monitoring shows: normal sinus rhythm 75.  The following chart(s) was reviewed:  Pediatric Pulmonary office visit dated 04/21/23 for CF.  Has been prescribed Ciprofloxacin  previously.   Medical Decision Making 23 year old male with history of cystic fibrosis presents with generalized weakness and malaise. Differential  diagnosis includes community-acquired pneumonia, cystic fibrosis exacerbation, acute bronchitis, anemia, sepsis, bacteremia, acute kidney injury, hypovolemia, COVID-19, influenza A or B, RSV. Workup here is consistent with elevated white blood count and right upper lobe pneumonia.  There is no hypoxia or organ failure.  Previous sputum cultures have shown Pseudomonas with only intermediate sensitivity to ciprofloxacin . Dr. Darryl on call for pulmonology recommends admission for IV Zosyn, nebulized 7% saline, and to follow recommendations by Dr. Marcos the cystic fibrosis pulmonologist at [facility].  These recommendations were placed, and a telephone encounter note from today.  Pulmonology recommends admission for IV Zosyn.  The patient initially was unsure but agreed to be admitted for cystic fibrosis airway/pulmonary clearance and IV antibiotics with Zosyn.  Dr. Jama Louder will admit this patient and follow Dr. Raeford recommendations  Problems Addressed: Cystic fibrosis    (CMD): complicated acute illness or injury Pneumonia of right upper lobe due to infectious organism: complicated acute illness or injury  Amount and/or Complexity of Data Reviewed Labs: ordered. Radiology: ordered.  Risk OTC drugs. Prescription drug management. Decision regarding hospitalization.    Clinical Complexity  Patient's presentation is most consistent with acute presentation with potential threat to life or bodily function.  Patient's history of Cystic Fibrosis increases the complexity of managing their  presentation with shortness of breath.    Provider time spent in patient care today, inclusive of but not limited to clinical reassessment, review of diagnostic studies, and discharge preparation, was greater than 30 minutes.        This document serves as a record of services personally performed by Fairy WENDI Brain, MD. It was created on their behalf by Asberry Opal, a trained  medical scribe. The creation of this record is the provider's dictation and/or activities during the visit.   Electronically signed by: Fairy Brain, MD 03/30/2024 6:05 PM

## 2024-03-30 NOTE — ED Triage Notes (Signed)
 Patient arrived from home with complaints of lung burning and shortness of breath that started last night and overall not feeling well for two weeks. Patient reports CF. Patient denies sick contacts and denies fevers at home.   Past Medical History:  Diagnosis Date  . ADHD (attention deficit hyperactivity disorder)   . Asthma (CMD)   . Cystic fibrosis    (CMD)   . Staph infection    recent hospitilization for staph infectionin lungs per dad

## 2024-03-31 NOTE — Nursing Note (Signed)
 Patient left AMA at 0125. Accepted 0100 dose of Zosyn. Ambulated from room to elevator in no distress with family and his belongings. Sent with Rx for Cipro  po to take outpatient. IV removed prior to departure.

## 2024-03-31 NOTE — Discharge Summary (Signed)
 Patient reports that the rooms are too small and are exacerbating his claustrophobia.  Patient has elected to leave the hospital AGAINST MEDICAL ADVICE.  Patient was provided with a prescription for oral ciprofloxacin .

## 2024-06-06 ENCOUNTER — Encounter (HOSPITAL_BASED_OUTPATIENT_CLINIC_OR_DEPARTMENT_OTHER): Payer: Self-pay

## 2024-06-06 ENCOUNTER — Emergency Department (HOSPITAL_BASED_OUTPATIENT_CLINIC_OR_DEPARTMENT_OTHER)
Admission: EM | Admit: 2024-06-06 | Discharge: 2024-06-06 | Disposition: A | Discharge status: Home / Self Care | Attending: Emergency Medicine | Admitting: Emergency Medicine

## 2024-06-06 ENCOUNTER — Other Ambulatory Visit: Payer: Self-pay

## 2024-06-06 DIAGNOSIS — F32A Depression, unspecified: Secondary | ICD-10-CM | POA: Insufficient documentation

## 2024-06-06 DIAGNOSIS — J45909 Unspecified asthma, uncomplicated: Secondary | ICD-10-CM | POA: Diagnosis not present

## 2024-06-06 LAB — CBC
HCT: 42.1 % (ref 39.0–52.0)
Hemoglobin: 14.5 g/dL (ref 13.0–17.0)
MCH: 30.1 pg (ref 26.0–34.0)
MCHC: 34.4 g/dL (ref 30.0–36.0)
MCV: 87.3 fL (ref 80.0–100.0)
Platelets: 333 K/uL (ref 150–400)
RBC: 4.82 MIL/uL (ref 4.22–5.81)
RDW: 12.9 % (ref 11.5–15.5)
WBC: 14 K/uL — ABNORMAL HIGH (ref 4.0–10.5)
nRBC: 0 % (ref 0.0–0.2)

## 2024-06-06 LAB — COMPREHENSIVE METABOLIC PANEL WITH GFR
ALT: 36 U/L (ref 0–44)
AST: 32 U/L (ref 15–41)
Albumin: 4.1 g/dL (ref 3.5–5.0)
Alkaline Phosphatase: 186 U/L — ABNORMAL HIGH (ref 38–126)
Anion gap: 12 (ref 5–15)
BUN: 10 mg/dL (ref 6–20)
CO2: 23 mmol/L (ref 22–32)
Calcium: 9.7 mg/dL (ref 8.9–10.3)
Chloride: 102 mmol/L (ref 98–111)
Creatinine, Ser: 0.79 mg/dL (ref 0.61–1.24)
GFR, Estimated: >60 mL/min (ref >60)
Glucose, Bld: 84 mg/dL (ref 70–99)
Potassium: 3.8 mmol/L (ref 3.5–5.1)
Sodium: 136 mmol/L (ref 135–145)
Total Bilirubin: 0.9 mg/dL (ref 0.0–1.2)
Total Protein: 7.7 g/dL (ref 6.5–8.1)

## 2024-06-06 LAB — URINE DRUG SCREEN
Amphetamines: NEGATIVE
Barbiturates: NEGATIVE
Benzodiazepines: NEGATIVE
Cocaine: NEGATIVE
Fentanyl: NEGATIVE
Methadone Scn, Ur: NEGATIVE
Opiates: NEGATIVE
Tetrahydrocannabinol: NEGATIVE

## 2024-06-06 LAB — ACETAMINOPHEN LEVEL
Acetaminophen (Tylenol), Serum: <10 ug/mL — ABNORMAL LOW (ref 10–30)
Acetaminophen (Tylenol), Serum: <10 ug/mL — ABNORMAL LOW (ref 10–30)

## 2024-06-06 LAB — SALICYLATE LEVEL: Salicylate Lvl: <7 mg/dL — ABNORMAL LOW (ref 7.0–30.0)

## 2024-06-06 LAB — ETHANOL: Alcohol, Ethyl (B): <15 mg/dL (ref <15)

## 2024-06-06 LAB — CBG MONITORING, ED: Glucose-Capillary: 95 mg/dL (ref 70–99)

## 2024-06-06 NOTE — ED Triage Notes (Signed)
 Per pt sister, pt took 25-30 200 mg Ibuprofen  x2 hours ago. Pt has hx of PTSD and depression. Per pt mother, pt has not attempted to overdose in the past. Pt recently on a break with significant other.

## 2024-06-06 NOTE — ED Notes (Signed)
 Pt d/c instructions, medications, and follow-up care reviewed with pt and family. Pt and family verbalized understanding and had no further questions at time of d/c. Pt CA&Ox4, ambulatory, and in NAD at time of d/c

## 2024-06-06 NOTE — Discharge Instructions (Addendum)
 Return to the emergency room if you have any concern about Frederick Curry's mental health.  Return to the emergency room if there is any additional ingestion or any attempts at self-harm.  Like we discussed, it is extremely important that you remove all medications in the home, or any materials which could be used to cause harm.  Follow-up with behavioral health this week.

## 2024-06-06 NOTE — ED Notes (Signed)
 Pt aware of the need for a urine... Pt currently unable to provide a sample.SABRASABRASABRA

## 2024-06-06 NOTE — ED Notes (Signed)
 Per Poison Control, suppurative care is recommended d/t non-toxic level amount ingested. Recommended to recheck acetaminophen  level 4 hours after time of ingestion. Observer for 6 hours after time of ingestion.   Time of Ingestion: 1600 06/06/24  Observe until 2200 06/06/24

## 2024-06-06 NOTE — ED Provider Notes (Signed)
 [place] EMERGENCY DEPARTMENT AT [address] Provider Note  CSN: [id number] Arrival date & time: 06/06/24 1658  Chief Complaint(s) Drug Overdose  HPI Frederick Curry is a 23 y.o. male who is here today after an intentional ingestion of ibuprofen .  Patient reportedly has been upset recently over a break-up.  He states he took the medicine because his heart hurts and he thought that this would help him stop hurting.  He denies suicidality.  No previous self-harm attempts.  Denies any other coingestions.  Mother reports calling poison control who who recommend the patient come to the emergency room.   He has a history of cystic fibrosis.   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 Authorizing Provider  albuterol (PROVENTIL HFA;VENTOLIN HFA) 108 (90 BASE) MCG/ACT inhaler Inhale into the lungs every 6 (six) hours as needed for wheezing or shortness of breath.    [provider]   AMOXICILLIN PO Take by mouth. Monday, Wednesday, Friday    [provider]  amphetamine-dextroamphetamine (ADDERALL XR) 20 MG 24 hr capsule Take 20 mg by mouth.    [provider]  azithromycin (ZITHROMAX) 500 MG tablet TAKE 1 TABLET EVERY MON/WED/FRI. 10/02/15   [provider]  beclomethasone (QVAR) 80 MCG/ACT inhaler Inhale into the lungs. 02/22/16   [provider]  Beclomethasone Dipropionate (QVAR IN) Inhale into the lungs.    [provider]  cyproheptadine (PERIACTIN) 4 MG tablet Take 8 mg by mouth. 03/04/16   [provider]  desmopressin (DDAVP NASAL) 0.01 % solution Place 10 mcg into the nose 2 (two) times daily.    [provider]  desmopressin (DDAVP) 0.2 MG tablet Take 3 tablets by mouth 1 time daily as directed. 12/14/15   [provider]  DIGESTIVE ENZYMES PO Take by mouth.    [provider]  dornase alpha (PULMOZYME) 1 MG/ML nebulizer solution Take by nebulization daily.    [provider]  dornase alpha (PULMOZYME) 1 MG/ML nebulizer solution Inhale into the lungs.    [provider]  fluticasone (FLONASE) 50 MCG/ACT nasal spray Place into both nostrils daily.    [provider]  fluticasone (FLONASE) 50 MCG/ACT nasal spray Place into the nose. 01/17/15   [provider]  hydrocortisone  1 % ointment Apply 1 application topically 2 (two) times daily. Apply to bilateral feet x 2 wks 04/24/16   Janit Thresa HERO, DPM  levofloxacin  (LEVAQUIN ) 750 MG tablet Take 1 tablet (750 mg total) by mouth daily. 09/17/23   Zavitz, Joshua, MD  lidocaine -prilocaine (EMLA) cream  Apply topically as needed. 10/25/15   [provider]  lipase/protease/amylase (CREON-12/PANCREASE) 12000 UNITS CPEP capsule Take by mouth.    [provider]  Lisdexamfetamine Dimesylate (VYVANSE PO) Take by mouth.    [provider]  Loratadine (CLARITIN PO) Take by mouth.    [provider]  methylphenidate (RITALIN) 10 MG tablet One by mouth each afternoon for homework. 02/12/16   [provider]  Multiple Vitamin (MULTIVITAMIN) tablet Take 1 tablet by mouth daily. Special mixture for cystic fibrosis    [provider]  Omeprazole (PRILOSEC PO) Take by mouth.    [provider]  omeprazole (PRILOSEC) 20 MG capsule Take 1 capsule by mouth 1 time daily. 12/15/15   [provider]  ondansetron  (ZOFRAN  ODT) 4 MG disintegrating tablet 4mg  ODT q4 hours prn nausea/vomit 11/02/13   Geroldine Berg, MD  Pancrelipase, Lip-Prot-Amyl, (CREON) 24000 units CPEP Take 5 capsules with meals and 3-4 with snacks. Brand name medically necessary. 07/07/15   [provider]  Polyethylene Glycol 3350 (MIRALAX PO) Take by mouth daily.    [provider]  polyethylene glycol powder (GLYCOLAX/MIRALAX) powder 17 g. 06/22/13   [provider]  sodium chloride  HYPERTONIC 3 % nebulizer solution Take by nebulization as needed for other.    [provider]  Sodium Chloride , Inhalant, (HYPER-SAL) 7 % NEBU Nebulize the contents of 1 vial in the morning and in the evening. 07/07/15   [provider]  Sodium Chloride , Inhalant, 7 % NEBU Nebulize the contents of 1 vial in the morning and in the evening. 07/07/15   [provider]  solifenacin (VESICARE) 5 MG tablet Take 5 mg by mouth. 04/19/15   [provider]  Tobramycin 28 MG CAPS Place into inhaler and inhale. 10/25/15   [provider]  Tobramycin POWD by Does not apply route. monthly    [provider]                                                                                                                                    Past Surgical History History reviewed. No pertinent surgical history. Family History History reviewed. No pertinent family history.  Social History Social History   Tobacco Use   Smoking status: Never   Substance Use Topics   Alcohol use: No   Drug use: No   Allergies Latex, Silver, and Tape  Review of Systems Review of Systems  Physical Exam Vital Signs  I have reviewed the triage vital signs BP 114/75   Pulse 70   Temp 98.3 F (36.8 C)   Resp 18   Ht 6' 1 (1.854 m)   Wt 72.6 kg   SpO2 97%   BMI 21.11 kg/m   Physical Exam Vitals and nursing note reviewed.  HENT:     Head: Normocephalic and atraumatic.  Neurological:     General: No focal deficit present.  Mental Status: He is alert.  Psychiatric:     Comments: Somewhat blunted affect.  Patient intermittently tearful.  Denies SI or HI.     ED Results and Treatments Labs (all labs ordered are listed, but only abnormal results are displayed) Labs Reviewed  COMPREHENSIVE METABOLIC PANEL WITH GFR - Abnormal; Notable for the following components:      Result Value   Alkaline Phosphatase 186 (*)    All other components within normal limits  CBC - Abnormal; Notable for the following components:   WBC 14.0 (*)    All other components within normal limits  SALICYLATE LEVEL - Abnormal; Notable for the following components:   Salicylate Lvl <7.0 (*)    All other components within normal limits  ACETAMINOPHEN  LEVEL - Abnormal; Notable for the following components:   Acetaminophen  (Tylenol ), Serum <10 (*)    All other components within normal limits  ACETAMINOPHEN  LEVEL - Abnormal; Notable for the following components:   Acetaminophen  (Tylenol ), Serum <10 (*)    All other components within normal limits  ETHANOL  URINE DRUG SCREEN  CBG MONITORING, ED                                                                                                                          Radiology No results found.  Pertinent labs & imaging results that were available during my care of the patient were reviewed by me and considered in my medical decision making (see MDM for details).  Medications Ordered in ED Medications -  No data to display                                                                                                                                   Procedures Procedures  (including critical care time)  Medical Decision Making / ED Course   This patient presents to the ED for concern of intentional ibuprofen  ingestion, this involves an extensive number of treatment options, and is a complaint that carries with it a high risk of complications and morbidity.  The differential diagnosis includes intentional ingestion, self-harm.  MDM: Regarding the ibuprofen  ingestion.  Poison control recommending a observation period until 10 PM.  Repeat Tylenol  level at 8 PM.  Regarding the intentional ingestion and concern for self-harm, a bit of a tricky scenario.  The patient has some mild cognitive delay as explained to me by the patient's  sister and mother.  How it was explained to me, it is that the patient does not fully understand what that act meant.  They believe the patient's intention was to try to reduce the pain that he felt in his heart.  I explained how typical protocol for intentional ingestion would be to fill out an IVC and have the patient evaluated by TTS.  When I mention to this possibility, patient's mother very forcefully stated that she was not interested in placing the patient in the involuntary commitment process would do more harm to the patient., did not believe it was necessary.  She stated that they had had negative experiences with other family members in the [facility] psychiatric health system and they strongly preferred to pursue this from an outpatient standpoint.    I explained my concern that the patient had intentionally ingested medications, and fortunately and ibuprofen  ingestion is overall benign, but asked what if he had taken acetaminophen  which could just of easily have been within the medicine cabinet.  Both sister and mother very firmly believed that that process would  lead to significantly more harm to the patient.  They explained to me that they had already established a plan to remove all medications from the home so the patient would not be able to access any.  They deny that there are any firearms in the home.  They state they are arranging for outpatient psychiatric follow-up tomorrow.  Ultimately, I think that if I were to involuntarily commit this patient, the patient's family would likely attempt to forcibly remove him.  At times, this conversation became somewhat hostile.  With the patient's mild cognitive delay, they very well may be correct that involuntarily committing the patient may lead to more harm.   Reassessment 9:45 PM-patient's 4-hour Tylenol  level is negative.  His blood work is reviewed.  Mild leukocytosis, not clinically relevant.  Renal function normal.  Continues to look well.  He again denies suicidal ideation.  Family reiterates that they will provide 24-hour monitoring for the patient at home and will have him see psychiatric care this week.  They have contracted for safety.  Will discharge patient.  They understand they may return to the emergency room for any additional events or concern for the patient's wellbeing.  Additional history obtained: -Additional history obtained from family at bedside -External records from outside source obtained and reviewed including: Chart review including previous notes, labs, imaging, consultation notes   Lab Tests: -I ordered, reviewed, and interpreted labs.   The pertinent results include:   Labs Reviewed  COMPREHENSIVE METABOLIC PANEL WITH GFR - Abnormal; Notable for the following components:      Result Value   Alkaline Phosphatase 186 (*)    All other components within normal limits  CBC - Abnormal; Notable for the following components:   WBC 14.0 (*)    All other components within normal limits  SALICYLATE LEVEL - Abnormal; Notable for the following components:   Salicylate Lvl <7.0 (*)     All other components within normal limits  ACETAMINOPHEN  LEVEL - Abnormal; Notable for the following components:   Acetaminophen  (Tylenol ), Serum <10 (*)    All other components within normal limits  ACETAMINOPHEN  LEVEL - Abnormal; Notable for the following components:   Acetaminophen  (Tylenol ), Serum <10 (*)    All other components within normal limits  ETHANOL  URINE DRUG SCREEN  CBG MONITORING, ED      EKG my independent review of the patient's EKG shows  no ST segment depressions or elevations, no T wave inversions, no evidence of acute ischemia.  Benign early repull.  EKG Interpretation Date/Time:  Sunday June 06 2024 18:05:03 EDT Ventricular Rate:  66 PR Interval:  123 QRS Duration:  106 QT Interval:  385 QTC Calculation: 404 R Axis:   87  Text Interpretation: Sinus rhythm Benign early repolarization Confirmed by Mannie Pac [phone]) on 06/06/2024 8:04:50 PM        Medicines ordered and prescription drug management: No orders of the defined types were placed in this encounter.   -I have reviewed the patients home medicines and have made adjustments as needed   Cardiac Monitoring: The patient was maintained on a cardiac monitor.  I personally viewed and interpreted the cardiac monitored which showed an underlying rhythm of: Normal sinus rhythm  Social Determinants of Health:  Factors impacting patients care include: Lack of access to primary care   Reevaluation: After the interventions noted above, I reevaluated the patient and found that they have :improved  Co morbidities that complicate the patient evaluation  Past Medical History:  Diagnosis Date   ADD (attention deficit disorder)    Cystic fibrosis (HCC)    Seasonal allergies       Dispostion: I considered admission for this patient, however the patient's family did not believe that was within the patient's best interest and they declined behavioral health evaluation.     Final Clinical  Impression(s) / ED Diagnoses Final diagnoses:  Depression, unspecified depression type     @PCDICTATION @    Mannie Pac T, DO 06/06/24 2151

## 2024-06-06 NOTE — ED Notes (Signed)
 Advised patient to change into purple scrubs.  Patient became agitated and states I'm not changing into those.  Notified Charge RN [employer].
